# Patient Record
Sex: Male | Born: 2000 | Race: White | Hispanic: No | Marital: Single | Smoking: Never smoker
Health system: Southern US, Community
[De-identification: ages and names within clinical notes are randomized; demographics above are authoritative.]

---

## 2019-10-20 ENCOUNTER — Emergency Department (HOSPITAL_COMMUNITY): Payer: No Typology Code available for payment source

## 2019-10-20 ENCOUNTER — Emergency Department (HOSPITAL_COMMUNITY)
Admission: EM | Admit: 2019-10-20 | Discharge: 2019-10-21 | Disposition: A | Discharge status: Home / Self Care | Payer: No Typology Code available for payment source | Attending: Emergency Medicine | Admitting: Emergency Medicine

## 2019-10-20 ENCOUNTER — Encounter (HOSPITAL_COMMUNITY): Payer: Self-pay | Admitting: Emergency Medicine

## 2019-10-20 ENCOUNTER — Other Ambulatory Visit: Payer: Self-pay

## 2019-10-20 DIAGNOSIS — F419 Anxiety disorder, unspecified: Secondary | ICD-10-CM | POA: Diagnosis not present

## 2019-10-20 DIAGNOSIS — R079 Chest pain, unspecified: Secondary | ICD-10-CM | POA: Diagnosis present

## 2019-10-20 LAB — CBC
HCT: 44.3 % (ref 39.0–52.0)
Hemoglobin: 15.8 g/dL (ref 13.0–17.0)
MCH: 31.6 pg (ref 26.0–34.0)
MCHC: 35.7 g/dL (ref 30.0–36.0)
MCV: 88.6 fL (ref 80.0–100.0)
Platelets: 245 10*3/uL (ref 150–400)
RBC: 5 MIL/uL (ref 4.22–5.81)
RDW: 12 % (ref 11.5–15.5)
WBC: 5.8 10*3/uL (ref 4.0–10.5)
nRBC: 0 % (ref 0.0–0.2)

## 2019-10-20 LAB — BASIC METABOLIC PANEL
Anion gap: 9 (ref 5–15)
BUN: 14 mg/dL (ref 6–20)
CO2: 27 mmol/L (ref 22–32)
Calcium: 9.7 mg/dL (ref 8.9–10.3)
Chloride: 104 mmol/L (ref 98–111)
Creatinine, Ser: 0.98 mg/dL (ref 0.61–1.24)
GFR calc Af Amer: >60 mL/min (ref >60)
GFR calc non Af Amer: >60 mL/min (ref >60)
Glucose, Bld: 95 mg/dL (ref 70–99)
Potassium: 4.7 mmol/L (ref 3.5–5.1)
Sodium: 140 mmol/L (ref 135–145)

## 2019-10-20 LAB — TROPONIN I (HIGH SENSITIVITY): Troponin I (High Sensitivity): <2 ng/L (ref <18)

## 2019-10-20 MED ORDER — SODIUM CHLORIDE 0.9% FLUSH: 3.0000 mL | Freq: Once | INTRAVENOUS | Status: DC

## 2019-10-20 NOTE — ED Triage Notes (Signed)
Patient arrived with EMS from school dorm[facility] reports intermittent left chest pain for several days with SOB and nausea , denies chest pain at arrival , no cough or fever .

## 2019-10-21 LAB — TROPONIN I (HIGH SENSITIVITY): Troponin I (High Sensitivity): <2 ng/L (ref <18)

## 2019-10-21 NOTE — Discharge Instructions (Addendum)
Follow-up with a local clinician to discuss starting on anxiety/depression medication in the next 2 weeks.  Return for thoughts of self-harm or new or worsening symptoms.  You can try Tylenol or Motrin for pain.

## 2019-10-21 NOTE — ED Provider Notes (Addendum)
[facility] EMERGENCY DEPARTMENT Provider Note   CSN: [id number] Arrival date & time: 10/20/19  2115     History Chief Complaint  Patient presents with  . Chest Pain    Ricky Frederick is a 19 y.o. male.  Patient currently at [facility] presents via EMS from dormitory for intermittent left chest pain, nausea and other symptoms.  Patient's had this past 3 to 4 days however has been battling this from his 5 years.  Patient feels every episodes related to anxiety.  Patient has family history concerning.  Patient denies any fevers or cough.  No specific exertional symptoms.  No known cardiac or lung disease.  Episodes ranging timing from 1 to 2 hours up to 3 hours in length.  Patient will get electric type shocks across his chest feel numb and tingly both arms.        History reviewed. No pertinent past medical history.  There are no problems to display for this patient.   History reviewed. No pertinent surgical history.     No family history on file.  Social History   Tobacco Use  . Smoking status: Never Smoker  . Smokeless tobacco: Never Used  Substance Use Topics  . Alcohol use: Never  . Drug use: Never    Home Medications Prior to Admission medications   Not on File    Allergies    Patient has no known allergies.  Review of Systems   Review of Systems  Constitutional: Positive for appetite change. Negative for chills and fever.  HENT: Negative for congestion.   Eyes: Negative for visual disturbance.  Respiratory: Positive for shortness of breath.   Cardiovascular: Positive for chest pain. Negative for leg swelling.  Gastrointestinal: Positive for nausea. Negative for abdominal pain and vomiting.  Genitourinary: Negative for dysuria and flank pain.  Musculoskeletal: Negative for back pain, neck pain and neck stiffness.  Skin: Negative for rash.  Neurological: Positive for numbness. Negative for light-headedness and headaches.     Physical Exam Updated Vital Signs BP 123/89 (BP Location: Right Arm)   Pulse 79   Temp 99 F (37.2 C) (Oral)   Resp 16   Ht 5' 6.5" (1.689 m)   Wt 79.4 kg   SpO2 99%   BMI 27.82 kg/m   Physical Exam Vitals and nursing note reviewed.  Constitutional:      Appearance: He is well-developed.  HENT:     Head: Normocephalic and atraumatic.  Eyes:     General:        Right eye: No discharge.        Left eye: No discharge.     Conjunctiva/sclera: Conjunctivae normal.  Neck:     Trachea: No tracheal deviation.  Cardiovascular:     Rate and Rhythm: Normal rate and regular rhythm.     Heart sounds: Heart sounds not distant. No systolic murmur.  Pulmonary:     Effort: Pulmonary effort is normal.     Breath sounds: Normal breath sounds.  Abdominal:     General: There is no distension.     Palpations: Abdomen is soft.     Tenderness: There is no abdominal tenderness. There is no guarding.  Musculoskeletal:     Cervical back: Normal range of motion and neck supple.     Right lower leg: No edema.     Left lower leg: No edema.  Skin:    General: Skin is warm.     Findings: No rash.  Neurological:  General: No focal deficit present.     Mental Status: He is alert and oriented to person, place, and time.  Psychiatric:        Mood and Affect: Mood is anxious.     ED Results / Procedures / Treatments   Labs (all labs ordered are listed, but only abnormal results are displayed) Labs Reviewed  BASIC METABOLIC PANEL  CBC  TROPONIN I (HIGH SENSITIVITY)  TROPONIN I (HIGH SENSITIVITY)    EKG EKG Interpretation  Date/Time:  Monday October 20 2019 21:21:21 EST Ventricular Rate:  80 PR Interval:  146 QRS Duration: 88 QT Interval:  336 QTC Calculation: 387 R Axis:   50 Text Interpretation: Normal sinus rhythm with sinus arrhythmia Normal ECG Confirmed by Elnora Morrison [phone]) on 10/20/2019 11:05:31 PM   Radiology DG Chest 2 View  Result Date: 10/20/2019 CLINICAL  DATA:  Intermittent left chest pain for several days EXAM: CHEST - 2 VIEW COMPARISON:  None. FINDINGS: The heart size and mediastinal contours are within normal limits. Both lungs are clear. The visualized skeletal structures are unremarkable. IMPRESSION: No active cardiopulmonary disease. Electronically Signed   By: Randa Ngo M.D.   On: 10/20/2019 21:42    Procedures Procedures (including critical care time)  Medications Ordered in ED Medications  sodium chloride flush (NS) 0.9 % injection 3 mL (has no administration in time range)    ED Course  I have reviewed the triage vital signs and the nursing notes.  Pertinent labs & imaging results that were available during my care of the patient were reviewed by me and considered in my medical decision making (see chart for details).    MDM Rules/Calculators/A&P                      Patient presents with intermittent chest pain.  Patient is low risk based on risk factors.  Patient's recurrent symptoms seem to be related and worse with anxiety.  We discussed the importance of follow-up with a local physician to discuss trial of antidepressant since patient has had the symptoms worsening for 5 years.  Patient symptoms improved while waiting in the ER.  Patient stable for outpatient follow-up.  Blood work ordered and reviewed normal hemoglobin, normal electrolytes, negative troponin, EKG no acute findings, chest x-ray no acute findings. PERC neg.  Ricky Frederick was evaluated in Emergency Department on 10/21/2019 for the symptoms described in the history of present illness. He was evaluated in the context of the global COVID-19 pandemic, which necessitated consideration that the patient might be at risk for infection with the SARS-CoV-2 virus that causes COVID-19. Institutional protocols and algorithms that pertain to the evaluation of patients at risk for COVID-19 are in a state of rapid change based on information released by regulatory bodies  including the CDC and federal and state organizations. These policies and algorithms were followed during the patient's care in the ED.  Final Clinical Impression(s) / ED Diagnoses Final diagnoses:  Left-sided chest pain  Anxiety    Rx / DC Orders ED Discharge Orders    None       Elnora Morrison, MD 10/21/19 [id number]    Elnora Morrison, MD 10/21/19 [phone]

## 2020-07-18 IMAGING — DX DG CHEST 2V
2 series · 2 of 2 positions shown · non-contrast
Comparison: None.

CLINICAL DATA: Intermittent left chest pain for several days

EXAM:
CHEST - 2 VIEW

[chest pa]
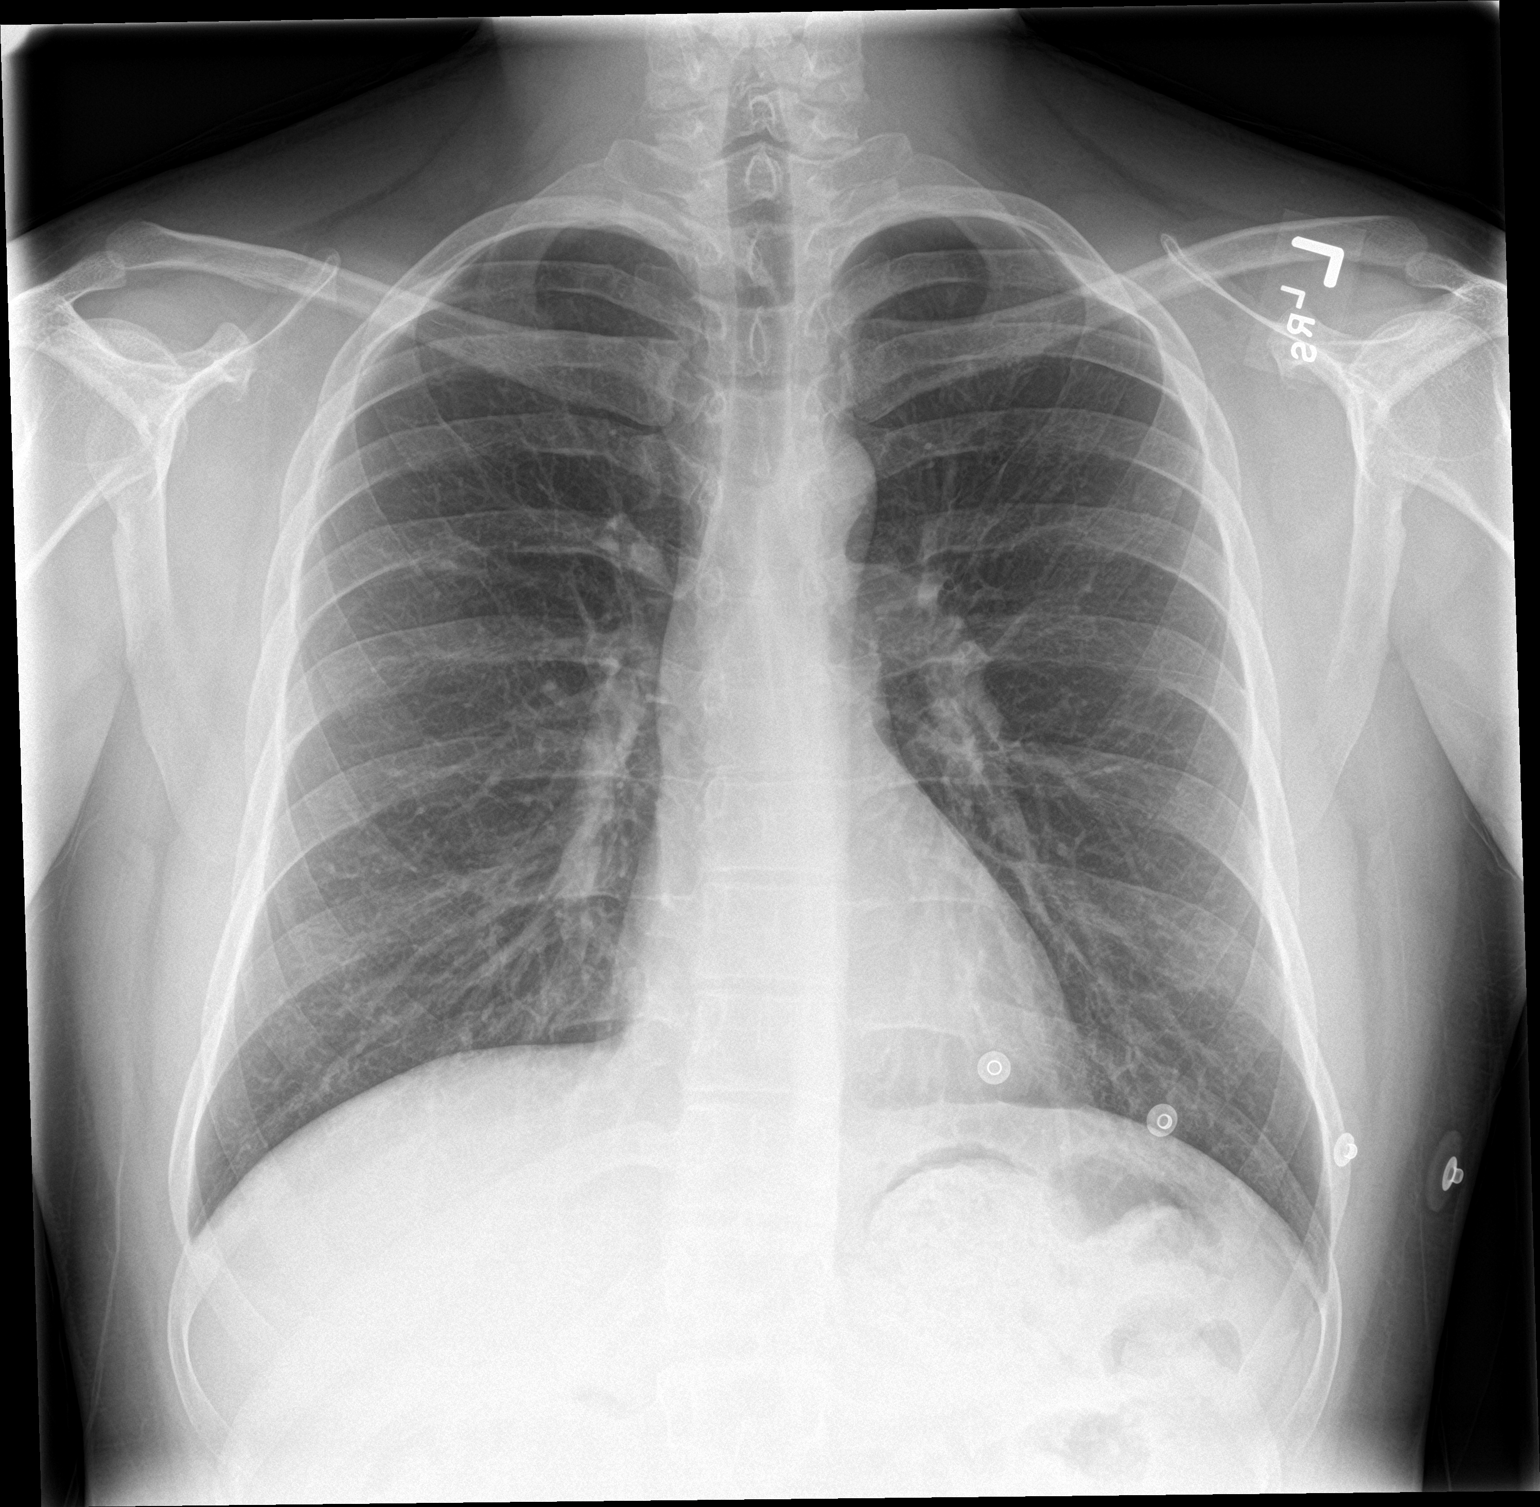

[chest lat]
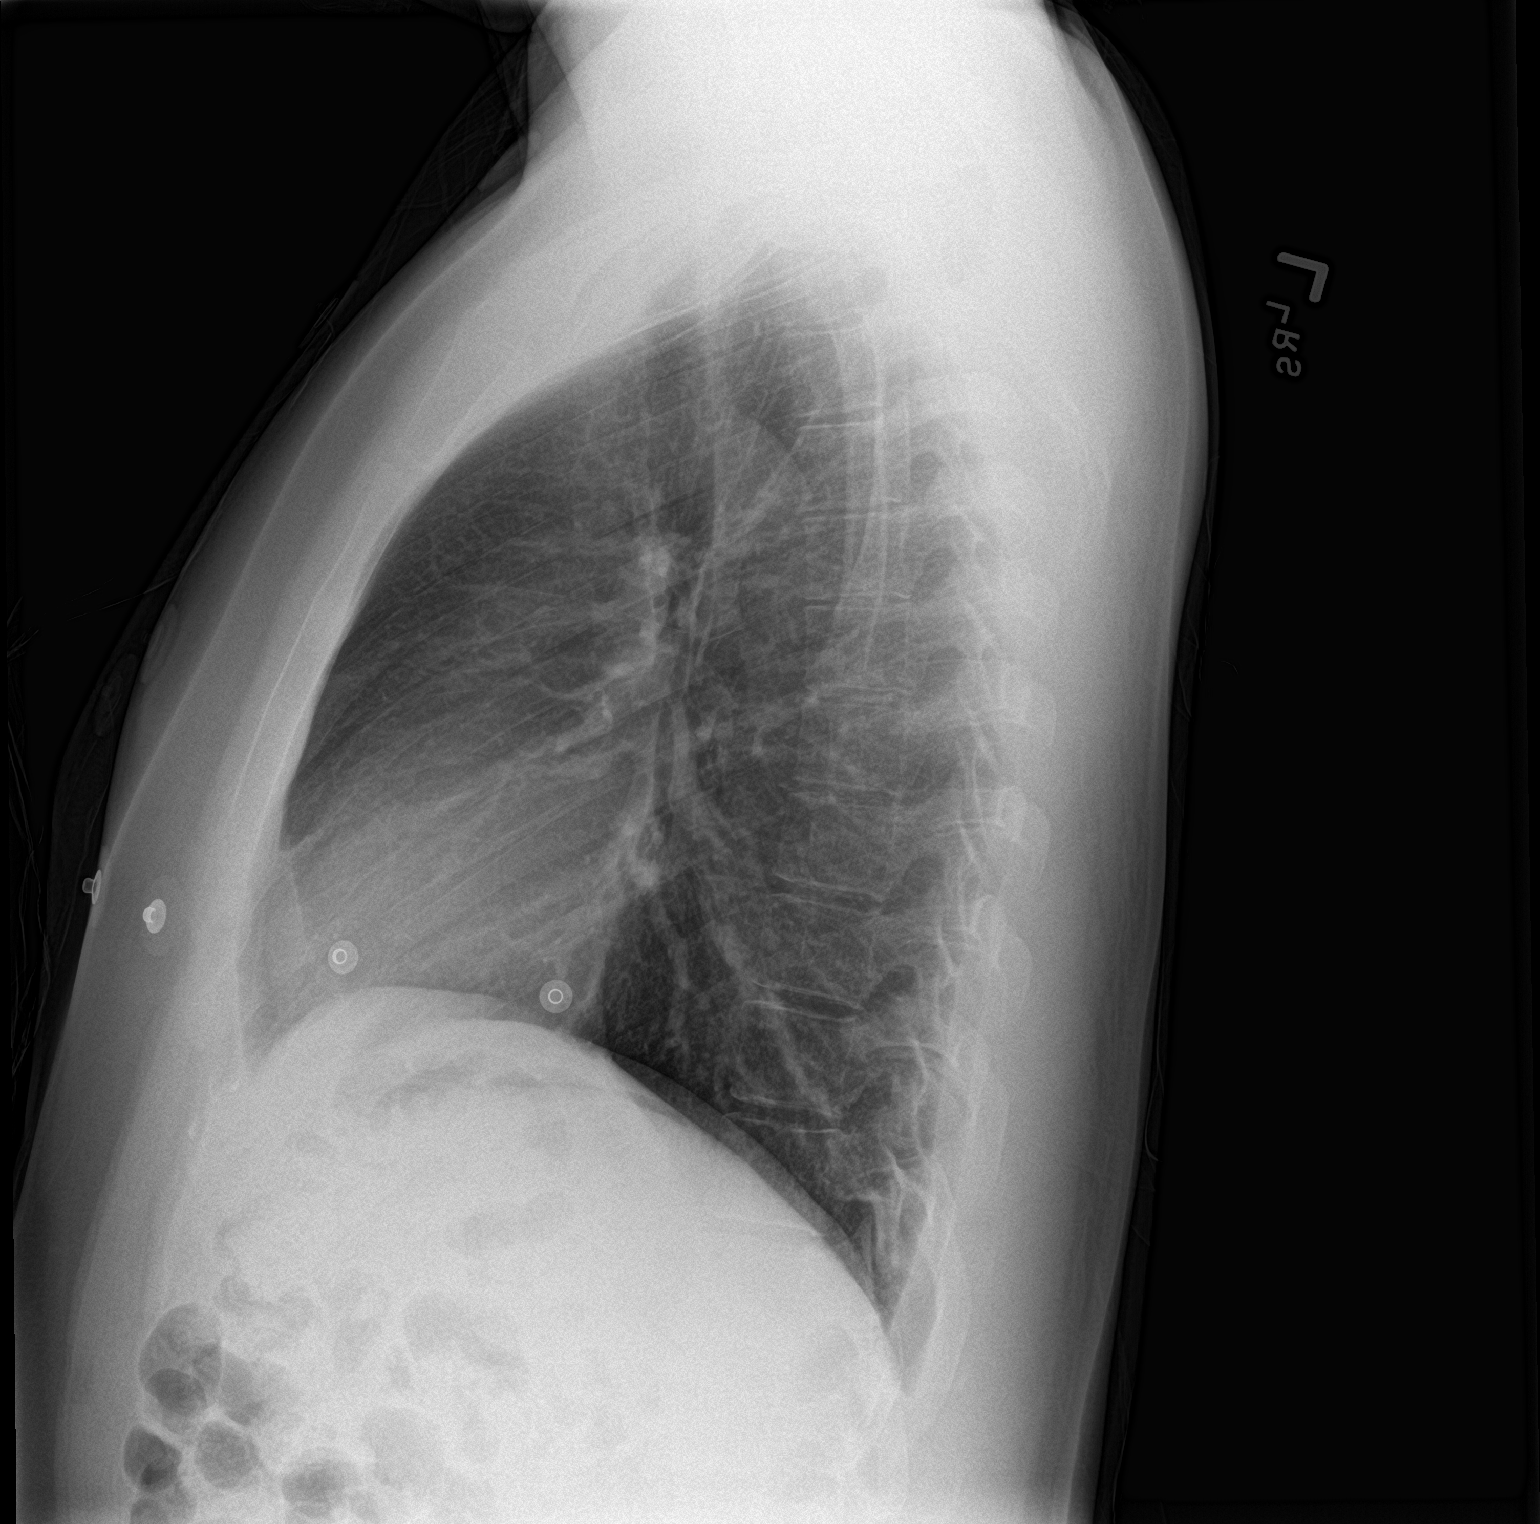

[2 of 2 positions shown; findings below may reference images not displayed]

FINDINGS: The heart size and mediastinal contours are within normal limits.
Both lungs are clear. The visualized skeletal structures are
unremarkable.
IMPRESSION: No active cardiopulmonary disease.
